# Patient Record
Sex: Male | Born: 1995
Health system: Southern US, Community
[De-identification: ages and names within clinical notes are randomized; demographics above are authoritative.]

---
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
## 2014-08-25 ENCOUNTER — Ambulatory Visit: Payer: 59

## 2016-11-21 DIAGNOSIS — Z23 Encounter for immunization: Secondary | ICD-10-CM | POA: Diagnosis not present

## 2017-11-19 DIAGNOSIS — Z0000 Encounter for general adult medical examination without abnormal findings: Secondary | ICD-10-CM | POA: Diagnosis not present

## 2017-11-19 DIAGNOSIS — Z111 Encounter for screening for respiratory tuberculosis: Secondary | ICD-10-CM | POA: Diagnosis not present

## 2018-03-20 DIAGNOSIS — S8252XA Displaced fracture of medial malleolus of left tibia, initial encounter for closed fracture: Secondary | ICD-10-CM | POA: Diagnosis not present

## 2018-03-20 DIAGNOSIS — S93421A Sprain of deltoid ligament of right ankle, initial encounter: Secondary | ICD-10-CM | POA: Diagnosis not present

## 2018-05-18 DIAGNOSIS — S838X1A Sprain of other specified parts of right knee, initial encounter: Secondary | ICD-10-CM | POA: Diagnosis not present

## 2018-05-20 DIAGNOSIS — M238X1 Other internal derangements of right knee: Secondary | ICD-10-CM | POA: Diagnosis not present

## 2018-05-20 DIAGNOSIS — M25461 Effusion, right knee: Secondary | ICD-10-CM | POA: Diagnosis not present

## 2018-05-26 DIAGNOSIS — M25461 Effusion, right knee: Secondary | ICD-10-CM | POA: Diagnosis not present

## 2018-05-28 DIAGNOSIS — M238X1 Other internal derangements of right knee: Secondary | ICD-10-CM | POA: Diagnosis not present

## 2018-05-28 DIAGNOSIS — S83191D Other subluxation of right knee, subsequent encounter: Secondary | ICD-10-CM | POA: Diagnosis not present

## 2018-06-18 DIAGNOSIS — S83091D Other subluxation of right patella, subsequent encounter: Secondary | ICD-10-CM | POA: Diagnosis not present

## 2018-06-18 DIAGNOSIS — X58XXXD Exposure to other specified factors, subsequent encounter: Secondary | ICD-10-CM | POA: Diagnosis not present

## 2018-06-24 DIAGNOSIS — Z2089 Contact with and (suspected) exposure to other communicable diseases: Secondary | ICD-10-CM | POA: Diagnosis not present

## 2018-08-28 DIAGNOSIS — Z0000 Encounter for general adult medical examination without abnormal findings: Secondary | ICD-10-CM | POA: Diagnosis not present

## 2018-11-18 DIAGNOSIS — M2341 Loose body in knee, right knee: Secondary | ICD-10-CM | POA: Diagnosis not present

## 2018-11-18 DIAGNOSIS — S83004A Unspecified dislocation of right patella, initial encounter: Secondary | ICD-10-CM | POA: Diagnosis not present

## 2018-11-18 DIAGNOSIS — M25561 Pain in right knee: Secondary | ICD-10-CM | POA: Diagnosis not present

## 2018-11-21 DIAGNOSIS — Z01818 Encounter for other preprocedural examination: Secondary | ICD-10-CM | POA: Diagnosis not present

## 2018-12-02 DIAGNOSIS — Z23 Encounter for immunization: Secondary | ICD-10-CM | POA: Diagnosis not present

## 2018-12-02 DIAGNOSIS — Z111 Encounter for screening for respiratory tuberculosis: Secondary | ICD-10-CM | POA: Diagnosis not present

## 2018-12-24 DIAGNOSIS — M2241 Chondromalacia patellae, right knee: Secondary | ICD-10-CM | POA: Diagnosis not present

## 2018-12-24 DIAGNOSIS — G8918 Other acute postprocedural pain: Secondary | ICD-10-CM | POA: Diagnosis not present

## 2018-12-24 DIAGNOSIS — M2201 Recurrent dislocation of patella, right knee: Secondary | ICD-10-CM | POA: Diagnosis not present

## 2018-12-24 DIAGNOSIS — M2341 Loose body in knee, right knee: Secondary | ICD-10-CM | POA: Diagnosis not present

## 2019-01-08 DIAGNOSIS — M2341 Loose body in knee, right knee: Secondary | ICD-10-CM | POA: Diagnosis not present

## 2019-01-09 DIAGNOSIS — B379 Candidiasis, unspecified: Secondary | ICD-10-CM | POA: Diagnosis not present

## 2019-01-20 DIAGNOSIS — Z1159 Encounter for screening for other viral diseases: Secondary | ICD-10-CM | POA: Diagnosis not present

## 2019-01-23 DIAGNOSIS — A689 Relapsing fever, unspecified: Secondary | ICD-10-CM | POA: Diagnosis not present

## 2019-01-23 DIAGNOSIS — M01X0 Direct infection of unspecified joint in infectious and parasitic diseases classified elsewhere: Secondary | ICD-10-CM | POA: Diagnosis not present

## 2019-01-24 ENCOUNTER — Emergency Department (HOSPITAL_COMMUNITY): Payer: BC Managed Care – PPO

## 2019-01-24 ENCOUNTER — Emergency Department (HOSPITAL_COMMUNITY)
Admission: EM | Admit: 2019-01-24 | Discharge: 2019-01-24 | Disposition: A | Discharge status: Home / Self Care | Payer: BC Managed Care – PPO | Attending: Emergency Medicine | Admitting: Emergency Medicine

## 2019-01-24 ENCOUNTER — Other Ambulatory Visit: Payer: Self-pay

## 2019-01-24 ENCOUNTER — Encounter (HOSPITAL_COMMUNITY): Payer: Self-pay

## 2019-01-24 DIAGNOSIS — R161 Splenomegaly, not elsewhere classified: Secondary | ICD-10-CM | POA: Insufficient documentation

## 2019-01-24 DIAGNOSIS — R748 Abnormal levels of other serum enzymes: Secondary | ICD-10-CM | POA: Diagnosis not present

## 2019-01-24 DIAGNOSIS — B2790 Infectious mononucleosis, unspecified without complication: Secondary | ICD-10-CM | POA: Insufficient documentation

## 2019-01-24 DIAGNOSIS — Z20828 Contact with and (suspected) exposure to other viral communicable diseases: Secondary | ICD-10-CM | POA: Diagnosis not present

## 2019-01-24 DIAGNOSIS — R7989 Other specified abnormal findings of blood chemistry: Secondary | ICD-10-CM | POA: Diagnosis not present

## 2019-01-24 DIAGNOSIS — R945 Abnormal results of liver function studies: Secondary | ICD-10-CM | POA: Diagnosis not present

## 2019-01-24 DIAGNOSIS — R509 Fever, unspecified: Secondary | ICD-10-CM | POA: Diagnosis not present

## 2019-01-24 LAB — COMPREHENSIVE METABOLIC PANEL
ALT: 367 U/L — ABNORMAL HIGH (ref 0–44)
AST: 309 U/L — ABNORMAL HIGH (ref 15–41)
Albumin: 4.4 g/dL (ref 3.5–5.0)
Alkaline Phosphatase: 241 U/L — ABNORMAL HIGH (ref 38–126)
Anion gap: 10 (ref 5–15)
BUN: 7 mg/dL (ref 6–20)
CO2: 25 mmol/L (ref 22–32)
Calcium: 9.6 mg/dL (ref 8.9–10.3)
Chloride: 104 mmol/L (ref 98–111)
Creatinine, Ser: 0.74 mg/dL (ref 0.61–1.24)
GFR calc Af Amer: >60 mL/min (ref >60)
GFR calc non Af Amer: >60 mL/min (ref >60)
Glucose, Bld: 89 mg/dL (ref 70–99)
Potassium: 3.8 mmol/L (ref 3.5–5.1)
Sodium: 139 mmol/L (ref 135–145)
Total Bilirubin: 1.9 mg/dL — ABNORMAL HIGH (ref 0.3–1.2)
Total Protein: 8.2 g/dL — ABNORMAL HIGH (ref 6.5–8.1)

## 2019-01-24 LAB — URINALYSIS, ROUTINE W REFLEX MICROSCOPIC
Glucose, UA: NEGATIVE mg/dL
Hgb urine dipstick: NEGATIVE
Ketones, ur: NEGATIVE mg/dL
Leukocytes,Ua: NEGATIVE
Nitrite: NEGATIVE
Protein, ur: 100 mg/dL — AB
Specific Gravity, Urine: 1.029 (ref 1.005–1.030)
pH: 6 (ref 5.0–8.0)

## 2019-01-24 LAB — HIV ANTIBODY (ROUTINE TESTING W REFLEX): HIV Screen 4th Generation wRfx: NONREACTIVE

## 2019-01-24 LAB — LACTIC ACID, PLASMA: Lactic Acid, Venous: 1.3 mmol/L (ref 0.5–1.9)

## 2019-01-24 LAB — MONONUCLEOSIS SCREEN: Mono Screen: POSITIVE — AB

## 2019-01-24 NOTE — ED Provider Notes (Signed)
  Physical Exam  BP (!) 141/88 (BP Location: Left Arm)   Pulse (!) 101   Temp 98.1 F (36.7 C) (Oral)   Resp 17   Ht 6\' 2"  (1.88 m)   Wt 83.9 kg   SpO2 99%   BMI 23.75 kg/m   Physical Exam  ED Course/Procedures     Procedures  MDM  Care assumed at 3:30 pm.  Patient had recent PCL repair and has been having persistent fevers for several weeks.  Patient was seen by Ortho yesterday and did not think that he had a joint infection.  Patient was sent in for fever of unknown origin.  Started off pending lab work including mononucleosis as well as chest x-ray and urinalysis.  6:57 PM Afebrile. Lactate normal. LFTs elevated and US showed splenomegaly. CXR and UA normal. Mono positive. Persistent fevers likely from mono. Stable for discharge. EBV titers sent and recommend repeat LFTs with PCP in a week       Drenda Freeze, MD 01/24/19 1858

## 2019-01-24 NOTE — Discharge Instructions (Signed)
Take motrin for fever. Take tylenol if the fever doesn't resolve with motrin.   Repeat liver function test in a week with your doctor   See your doctor in a week   No contact sports.   Return to ER if you have severe abdominal pain, vomiting.

## 2019-01-24 NOTE — ED Triage Notes (Signed)
patient states he has been having a fever at home from 100.00 to 102.0. Patient recently had right knee surgery and went to see his orthopedic surgeon yesterday and today. Patient was afebrile in triage. Patient was referred to the ED. Patient states he had a negative strep and Covid test 4 days ago. Patient c/o fever, headache, swollen lymph nodes right side of neck, abdominal pain.

## 2019-01-24 NOTE — ED Provider Notes (Signed)
[facility]-EMERGENCY DEPT Provider Note   CSN: [id number] Arrival date & time: 01/24/19  1339     History   Chief Complaint Chief Complaint  Patient presents with  . Fever    HPI Chase Adams is a 23 y.o. male with a history of right PCL repair approximately 3 to 4 weeks ago presenting to emergency department fever of unknown origin.  Patient reports he has had no complications since his surgery in September.  However he noted that 5 days ago while he was taken a vacation to the beach, he began developing a headache and fevers.  He has reported persistent night sweats for the past 5 days with temperatures ranging up to 102.5.  He describes intermittent headaches which are worse in the first few days but have been improving since then.  He also describes swollen lymph nodes in his neck.  He denies sore throat.  He denies abdominal pain.  He reports diminished appetite but denies vomiting.  Reports some loose stools.  He denies any new rashes.  He denies any new pain in his extremities including her surgical site.  The patient was seen in urgent care 4 days ago and had a Covid test at that time which was negative.  He was started on Keflex for possible skin infection.  He was subsequently had blood tests done as an outpatient at [facility] yesterday.  He showed me the results on his phone and had CBC done which showed white blood cell count of 4.5.  His hemoglobin was normal and had mild thrombocytopenia with a platelet count of 119.  He had no neutrophilia.  Yesterday the patient was seen at his orthopedic office and was started on doxycycline for skin coverage.  He returned to the orthopedist office today and was seen by Dr. Thomasena Edisollins in the office.  I spoke to Dr. Thomasena Edisollins and he reports that he had low concern for an infected joint, but did refer the patient to the hospital for general fever work-up.  Dr. Thomasena Edisollins expressed concern for possible encephalitis or  meningitis given the patient's reported headaches.  The patient denies any rashes or any tick bites. To me the patient reports that he has had some left upper abdominal mild aching pain and fullness, which is worse with inspiration.    HPI  History reviewed. No pertinent past medical history.  There are no active problems to display for this patient.   Past Surgical History:  Procedure Laterality Date  . KNEE SURGERY          Home Medications    Prior to Admission medications   Not on File    Family History Family History  Problem Relation Age of Onset  . Healthy Mother   . Healthy Father     Social History Social History   Tobacco Use  . Smoking status: Never Smoker  . Smokeless tobacco: Never Used  Substance Use Topics  . Alcohol use: Yes  . Drug use: Never     Allergies   Patient has no known allergies.   Review of Systems Review of Systems  Constitutional: Positive for appetite change. Negative for chills and fever.  HENT: Negative for congestion, drooling, ear pain, postnasal drip, rhinorrhea, sinus pressure, sinus pain, sore throat, trouble swallowing and voice change.   Eyes: Negative for photophobia and visual disturbance.  Respiratory: Negative for cough, shortness of breath and wheezing.   Cardiovascular: Negative for chest pain and palpitations.  Gastrointestinal: Positive for  diarrhea. Negative for abdominal pain, constipation, nausea and vomiting.  Genitourinary: Negative for difficulty urinating, dysuria, frequency, hematuria, penile swelling, scrotal swelling and testicular pain.  Musculoskeletal: Positive for myalgias. Negative for arthralgias and back pain.  Skin: Negative for pallor and rash.  Neurological: Positive for headaches. Negative for dizziness, seizures, syncope, light-headedness and numbness.  Psychiatric/Behavioral: Negative for agitation and confusion.  All other systems reviewed and are negative.    Physical Exam  Updated Vital Signs BP 128/88 (BP Location: Left Arm)   Pulse 79   Temp 97.8 F (36.6 C) (Oral)   Resp 16   Ht 6\' 2"  (1.88 m)   Wt 83.9 kg   SpO2 99%   BMI 23.75 kg/m   Physical Exam Vitals signs and nursing note reviewed.  Constitutional:      Appearance: He is well-developed.  HENT:     Head: Normocephalic and atraumatic.     Comments: Bilateral tender firm cervical lymphadenopathy (anterior cervical) TM clear bilaterally No sinus tenderness on exam Enlarged tonsillars bilaterally with no exudates, no unilateral swelling, no uvula deviaton Eyes:     Conjunctiva/sclera: Conjunctivae normal.     Pupils: Pupils are equal, round, and reactive to light.  Neck:     Musculoskeletal: Normal range of motion and neck supple. No neck rigidity or muscular tenderness.  Cardiovascular:     Rate and Rhythm: Normal rate and regular rhythm.     Pulses: Normal pulses.     Heart sounds: No murmur.  Pulmonary:     Effort: Pulmonary effort is normal. No respiratory distress.     Breath sounds: Normal breath sounds.  Abdominal:     General: Bowel sounds are normal. There is no distension.     Palpations: Abdomen is soft. There is no mass.     Tenderness: There is abdominal tenderness in the left upper quadrant. There is no right CVA tenderness, left CVA tenderness, guarding or rebound. Negative signs include Murphy's sign, Rovsing's sign and McBurney's sign.     Hernia: No hernia is present.     Comments: Mild splenomegaly  Musculoskeletal:     Comments: Right knee surgical incision site with mild surrounding erythema No purulence No effusion of the right knee Full ROM with minimal pain of the right knee No overlying warmth of knee  Skin:    General: Skin is warm and dry.  Neurological:     General: No focal deficit present.     Mental Status: He is alert and oriented to person, place, and time.     Sensory: No sensory deficit.     Motor: No weakness.  Psychiatric:        Mood and  Affect: Mood normal.        Behavior: Behavior normal.      ED Treatments / Results  Labs (all labs ordered are listed, but only abnormal results are displayed) Labs Reviewed  SARS CORONAVIRUS 2 (TAT 6-24 HRS)  CULTURE, BLOOD (ROUTINE X 2)  CULTURE, BLOOD (ROUTINE X 2)  MONONUCLEOSIS SCREEN  COMPREHENSIVE METABOLIC PANEL  URINALYSIS, ROUTINE W REFLEX MICROSCOPIC  EPSTEIN-BARR VIRUS VCA, IGG  EPSTEIN-BARR VIRUS VCA, IGM  HIV ANTIBODY (ROUTINE TESTING W REFLEX)  HIV4GL SAVE TUBE  LACTIC ACID, PLASMA    EKG None  Radiology Dg Chest Portable 1 View  Result Date: 01/24/2019 CLINICAL DATA:  Fever EXAM: PORTABLE CHEST 1 VIEW COMPARISON:  None. FINDINGS: Lungs are clear. Heart size and pulmonary vascularity are normal. No adenopathy. No bone lesions. IMPRESSION:  No edema or consolidation. Electronically Signed   By: Lowella Grip III M.D.   On: 01/24/2019 15:24    Procedures Procedures (including critical care time)  Medications Ordered in ED Medications - No data to display   Initial Impression / Assessment and Plan / ED Course  I have reviewed the triage vital signs and the nursing notes.  Pertinent labs & imaging results that were available during my care of the patient were reviewed by me and considered in my medical decision making (see chart for details).  23 year old male presenting to the emergency department with 5 days of cyclical fevers, occurring generally at night with night sweats, associated with intermittent headaches and myalgias.  He had an outpatient work-up including a negative strep test 4 days ago, negative Covid test 4 days ago, fairly unremarkable CBC yesterday.  He has been on Keflex for 4 days then switch to doxycycline yesterday by his orthopedic doctor for skin infection coverage, more as a prophylactic measure after my discussion with Dr. Theda Sers.  On his exam the patient appears extremely comfortable.  He is afebrile and has normal vital signs.   He does have cervical lymphadenopathy and a mildly enlarged splenomegaly, which may be consistent with mononucleosis.  He has no sore throat or tonsillar exudate suggestive of strep throat.  His lungs are clear to auscultation bilaterally.  He has no other focal abdominal tenderness or findings suggest acute inflammatory or infectious process, aside from mild splenomegaly.  He reports no GU or urinary symptoms suggestive of pyelonephritis cystitis or epididymitis.  His right knee has a scar which appears to be well-healed, has minimal erythema surrounding it.  He has full range of motion of the right joint and knee, and clinically of a very low suspicion for septic arthritis.  Dr. Theda Sers on the phone also reports to me that he had a very low suspicion for septic joint.  Overall my concern for meningitis or encephalitis is extremely low.  But I believe the patient has a bacterial meningitis given that he has had symptoms for 5 days, reports that his headache has been improving, and is so well-appearing clinically.  Currently he has no signs or symptoms of meningismus.  Do not suspect this is CNS infection.  After speaking with Dr. Theda Sers, believe it is reasonable to perform broad work-up here.  This include HIV testing, mono testing, chest x-ray, urine testing.  Will check CMP and CBC.  This may still be a viral syndrome.    Finally, I spoke to the patient obtaining blood cultures.  Although he does not appear septic at this time, given his recent surgery I still cannot completely rule out the possibility of bacteremia.  I will follow up on his cultures and contact him if positive, but I do not believe he requires hospitalization at this time.  This note was dictated using dragon dictation software.  Please be aware that there may be minor translation errors as a result of this oral dictation  Patient signed out to Dr Darl Householder, pending labs and disposition   Final Clinical Impressions(s) / ED  Diagnoses   Final diagnoses:  None    ED Discharge Orders    None       Herlinda Heady, Carola Rhine, MD 01/24/19 [phone]

## 2019-01-25 LAB — SARS CORONAVIRUS 2 (TAT 6-24 HRS): SARS Coronavirus 2: NEGATIVE

## 2019-01-25 LAB — EPSTEIN-BARR VIRUS VCA, IGG: EBV VCA IgG: 50.4 U/mL — ABNORMAL HIGH (ref 0.0–17.9)

## 2019-01-25 LAB — EPSTEIN-BARR VIRUS VCA, IGM: EBV VCA IgM: >160 U/mL — ABNORMAL HIGH (ref 0.0–35.9)

## 2019-01-29 LAB — CULTURE, BLOOD (ROUTINE X 2)
Culture: NO GROWTH
Special Requests: ADEQUATE

## 2019-01-31 DIAGNOSIS — M25561 Pain in right knee: Secondary | ICD-10-CM | POA: Diagnosis not present

## 2019-02-04 DIAGNOSIS — M25561 Pain in right knee: Secondary | ICD-10-CM | POA: Diagnosis not present

## 2019-02-04 DIAGNOSIS — J029 Acute pharyngitis, unspecified: Secondary | ICD-10-CM | POA: Diagnosis not present

## 2019-02-04 DIAGNOSIS — B2790 Infectious mononucleosis, unspecified without complication: Secondary | ICD-10-CM | POA: Diagnosis not present

## 2019-02-12 DIAGNOSIS — M25561 Pain in right knee: Secondary | ICD-10-CM | POA: Diagnosis not present

## 2019-02-14 DIAGNOSIS — M25561 Pain in right knee: Secondary | ICD-10-CM | POA: Diagnosis not present

## 2019-02-18 DIAGNOSIS — M25561 Pain in right knee: Secondary | ICD-10-CM | POA: Diagnosis not present

## 2019-02-26 DIAGNOSIS — M25561 Pain in right knee: Secondary | ICD-10-CM | POA: Diagnosis not present

## 2019-02-28 DIAGNOSIS — M25561 Pain in right knee: Secondary | ICD-10-CM | POA: Diagnosis not present

## 2019-03-17 DIAGNOSIS — M25561 Pain in right knee: Secondary | ICD-10-CM | POA: Diagnosis not present

## 2019-03-26 DIAGNOSIS — M25561 Pain in right knee: Secondary | ICD-10-CM | POA: Diagnosis not present

## 2019-04-01 DIAGNOSIS — M25561 Pain in right knee: Secondary | ICD-10-CM | POA: Diagnosis not present

## 2019-04-07 DIAGNOSIS — M25561 Pain in right knee: Secondary | ICD-10-CM | POA: Diagnosis not present

## 2019-04-14 ENCOUNTER — Ambulatory Visit: Payer: BC Managed Care – PPO | Attending: Internal Medicine

## 2019-04-14 DIAGNOSIS — T380X5A Adverse effect of glucocorticoids and synthetic analogues, initial encounter: Secondary | ICD-10-CM | POA: Diagnosis not present

## 2019-04-14 DIAGNOSIS — R6889 Other general symptoms and signs: Secondary | ICD-10-CM

## 2019-04-14 DIAGNOSIS — H40003 Preglaucoma, unspecified, bilateral: Secondary | ICD-10-CM | POA: Diagnosis not present

## 2019-04-15 LAB — NOVEL CORONAVIRUS, NAA: SARS-CoV-2, NAA: NOT DETECTED

## 2019-04-30 DIAGNOSIS — Z9889 Other specified postprocedural states: Secondary | ICD-10-CM | POA: Diagnosis not present

## 2019-05-07 DIAGNOSIS — M25561 Pain in right knee: Secondary | ICD-10-CM | POA: Diagnosis not present

## 2019-05-09 DIAGNOSIS — M25561 Pain in right knee: Secondary | ICD-10-CM | POA: Diagnosis not present

## 2019-05-21 DIAGNOSIS — M25561 Pain in right knee: Secondary | ICD-10-CM | POA: Diagnosis not present

## 2020-04-07 IMAGING — DX DG CHEST 1V PORT
1 series · 1 of 1 positions shown · non-contrast
Comparison: None.

CLINICAL DATA: Fever

EXAM:
PORTABLE CHEST 1 VIEW

[chest ap]
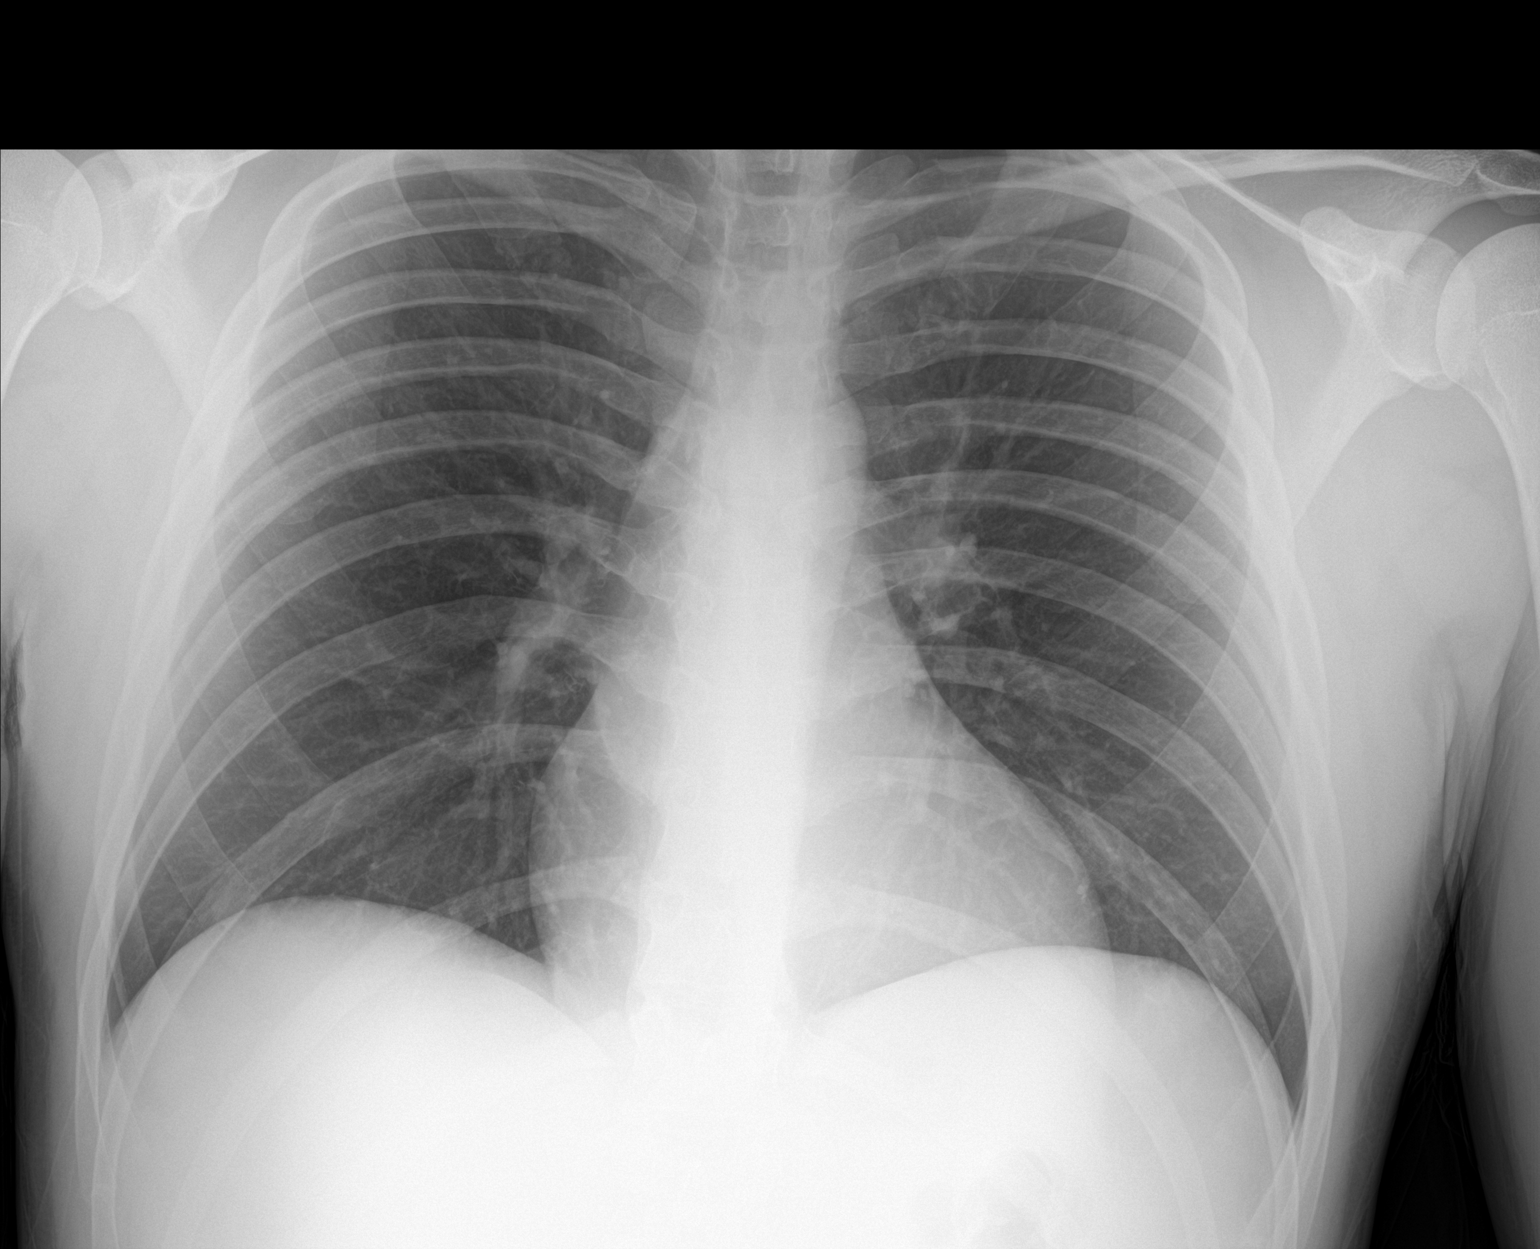

[1 of 1 positions shown; findings below may reference images not displayed]

FINDINGS: Lungs are clear. Heart size and pulmonary vascularity are normal. No
adenopathy. No bone lesions.
IMPRESSION: No edema or consolidation.
